# Patient Record
Sex: Female | Born: 1970
Health system: Southern US, Community
[De-identification: ages and names within clinical notes are randomized; demographics above are authoritative.]

## PROBLEM LIST (undated history)

## (undated) DIAGNOSIS — J45909 Unspecified asthma, uncomplicated: Secondary | ICD-10-CM

## (undated) HISTORY — PX: TUBAL LIGATION: SHX77

---

## 2002-02-04 ENCOUNTER — Emergency Department (HOSPITAL_COMMUNITY): Admission: EM | Admit: 2002-02-04 | Discharge: 2002-02-04 | Payer: Self-pay | Admitting: Emergency Medicine

## 2003-06-24 ENCOUNTER — Emergency Department (HOSPITAL_COMMUNITY): Admission: EM | Admit: 2003-06-24 | Discharge: 2003-06-24 | Payer: Self-pay | Admitting: *Deleted

## 2003-06-24 ENCOUNTER — Encounter: Payer: Self-pay | Admitting: Emergency Medicine

## 2015-07-25 ENCOUNTER — Ambulatory Visit
Admission: EM | Admit: 2015-07-25 | Discharge: 2015-07-25 | Discharge status: Absent without leave | Payer: BLUE CROSS/BLUE SHIELD

## 2015-07-25 ENCOUNTER — Encounter: Payer: Self-pay | Admitting: *Deleted

## 2015-07-25 ENCOUNTER — Ambulatory Visit: Payer: BLUE CROSS/BLUE SHIELD

## 2015-07-25 ENCOUNTER — Ambulatory Visit
Admission: EM | Admit: 2015-07-25 | Discharge: 2015-07-25 | Disposition: A | Discharge status: Home / Self Care | Payer: BLUE CROSS/BLUE SHIELD | Attending: Family Medicine | Admitting: Family Medicine

## 2015-07-25 DIAGNOSIS — S83001A Unspecified subluxation of right patella, initial encounter: Secondary | ICD-10-CM

## 2015-07-25 HISTORY — DX: Unspecified asthma, uncomplicated: J45.909

## 2015-07-25 MED ORDER — KETOROLAC TROMETHAMINE 60 MG/2ML IM SOLN
60.0000 mg | Freq: Once | INTRAMUSCULAR | Status: AC
  Administered 2015-07-25: 60 mg via INTRAMUSCULAR

## 2015-07-25 MED ORDER — TETANUS-DIPHTH-ACELL PERTUSSIS 5-2.5-18.5 LF-MCG/0.5 IM SUSP
0.5000 mL | Freq: Once | INTRAMUSCULAR | Status: AC
  Administered 2015-07-25: 0.5 mL via INTRAMUSCULAR

## 2015-07-25 NOTE — Discharge Instructions (Signed)
Please report directly to [facility] for evaluation and additional care- Do not initiate these exercises without their recommendation  Patellar Dislocation and Subluxation with Phase I Rehab Injuries to the knee often include kneecap (patellar) dislocation or subluxation. The patella is a V-shaped bone that sits in a groove in the thigh bone (trochlea). A patellar dislocation occurs when the kneecap is displaced from the trochlea, and the joint surfaces are no longer touching. A subluxation is a similar injury, where the kneecap becomes displaced, but the joint surfaces are still touching. Patellar dislocations and subluxations are most common in adolescents and younger adults. SYMPTOMS   Severe pain in the front of the knee when attempting to move the knee.  A feeling of the knee giving way.  Tenderness, swelling, and bruising (contusion) of the knee.  Numbness or paralysis below the dislocation, from pinching, cutting, or pressure on the blood vessels or nerves (uncommon).  Visible deformity, especially if the dislocation of the kneecap occurs towards the outside of the knee.  Lump on the inner knee, which is the end of the inner part of the thigh bone (femur). CAUSES  Patellar dislocations are caused by a force placed on the kneecap, that is strong enough to displace the bone from its proper alignment. Common causes of injury include:  Direct hit (trauma) to the knee.  Twisting or pivoting injury to the lower limb, when the foot is planted on the ground.  Powerful muscle contraction.  Birth defect (congenital abnormality), such as shallow or malformed joint surfaces. RISK INCREASES WITH:  Contact sports (football, rugby, soccer), sports that require jumping and landing (basketball, volleyball), or sports in which cleats are worn on shoes.  People with wide pelvis, knocked knees, shallow or malformed joint surfaces.  Previous knee injury.  Poor strength and  flexibility. PREVENTION  Warm up and stretch properly before activity.  Maintain physical fitness:  Strength, flexibility, and endurance.  Cardiovascular fitness.  For jumping or contact sports, protect the kneecap with supportive devices (elastic bandages, tape, braces, knee sleeves with a hole for the patella and a built-up outer side, or straps to pull the patella inward, or knee pads).  Use cleats of proper length. PROGNOSIS  If treated properly, patellar dislocations and subluxations usually require at least 6 weeks to heal.  RELATED COMPLICATIONS   Associated fracture or joint cartilage injury.  Damage to nearby nerves or major blood vessels (rare).  Longer healing time or recurring dislocation, if activity is resumed too soon.  Excessive bleeding within the knee, due to dislocation.  Kneecap pain and giving way, often due to inadequate or incomplete rehabilitation.  Unstable or arthritic joint, following repeated injury or delayed treatment. TREATMENT Patellar dislocations and subluxations require immediate realigning of the bones (reduction). Realigning is often completed by hand. However, surgery is sometimes needed. After realignment, treatment first involves the use of ice and medicine, to reduce pain and inflammation. Elevating the injured knee above the level of the heart will also help reduce inflammation. Restraining the knee is often needed, to allow for healing, for up to 6 weeks. After restraint, it is important to perform strengthening and stretching exercises to help regain strength and a full range of motion. These exercises may be completed at home or with a therapist. MEDICATION   If pain medicine is needed, nonsteroidal anti-inflammatory medicines (aspirin and ibuprofen), or other minor pain relievers (acetaminophen), are often advised.  Do not take pain medicine for 7 days before surgery.  Prescription pain  relievers may be given, if your caregiver thinks  they are needed. Use only as directed and only as much as you need. HEAT AND COLD  Cold treatment (icing) should be applied for 10 to 15 minutes every 2 to 3 hours for inflammation and pain, and immediately after activity that aggravates your symptoms. Use ice packs or an ice massage.  Heat treatment may be used before performing stretching and strengthening activities prescribed by your caregiver, physical therapist, or athletic trainer. Use a heat pack or a warm water soak. SEEK MEDICAL CARE IF:  Pain, tenderness, or swelling gets worse, despite treatment.  You experience pain, numbness, or coldness in the foot.  Blue, gray, or dark color appears in the toenails.  Any of the following signs of infection occur after surgery: fever, increased pain, swelling, redness, drainage of fluids, or bleeding in the affected area.  New, unexplained symptoms develop. (Drugs used in treatment may produce side effects.) EXERCISES PHASE I EXERCISES RANGE OF MOTION (ROM) AND STRETCHING EXERCISES - Patellar Dislocation and Subluxation Phase I  FIRST TIME DISLOCATIONS OR SUBLUXATIONS: If you have dislocated or subluxed your kneecap for the first time, your caregiver may ask you to wear a knee brace for up to 6 weeks after your injury. This brace will keep your knee completely straight so that the healing tissues are not disrupted by your knee movement. Your caregiver may allow some motion at your knee by using a hinged brace or by allowing you to take your brace off for a limited amount of time to gently bend and straighten your knee. If this is the case, closely follow your caregiver's directions, in order to allow the best recovery.   CHRONIC OR REPEATED DISLOCATIONS OR SUBLUXATIONS: If you have chronic or repeated dislocations or subluxations, your caregiver will likely not ask you to use a brace. He or she will likely have you begin gentle range of motion activities, within the range that is comfortable  for you.  Once you are allowed to start moving your knee, these are some of the initial exercises that may be included in your rehabilitation program. Continue to perform them until you see your caregiver again or until your symptoms go away. While completing these exercises, remember:   Restoring tissue flexibility helps normal motion to return to the joints. This allows healthier, less painful movement and activity.  An effective stretch should be held for at least 30 seconds.  A stretch should never be painful. You should only feel a gentle lengthening or release in the stretched tissue. RANGE OF MOTION - Knee Flexion, Active  Lie on your back with both knees straight. (If this causes back discomfort, bend your opposite knee, placing your foot flat on the floor.)  Slowly slide your heel back toward your buttocks until you feel a gentle stretch in the front of your knee or thigh.  Hold for __________ seconds. Slowly slide your heel back to the starting position. Repeat __________ times. Complete this exercise __________ times per day.  RANGE OF MOTION - Knee Flexion and Extension, Active-Assisted  Sit on the edge of a table or chair with your thighs firmly supported. It may be helpful to place a folded towel under the end of your right / left thigh.  Flexion (bending): Place the ankle of your healthy leg on top of the other ankle. Use your healthy leg to gently bend your right / left knee until you feel a mild tension across the top of your  knee.  Hold for __________ seconds.  Extension (straightening): Switch your ankles so your right / left leg is on top. Use your healthy leg to straighten your right / left knee until you feel a mild tension on the backside of your knee.  Hold for __________ seconds. Repeat __________ times. Complete this exercise __________ times per day. STRETCH - Knee Flexion, Supine  Lie on the floor with your right / left heel and foot lightly touching the  wall. (Place both feet on the wall if you do not use a door frame.)  Without using any effort, allow gravity to slide your foot down the wall slowly until you feel a gentle stretch in the front of your right / left knee.  Hold this stretch for __________ seconds. Then return the leg to the starting position, using your healthy leg for help, if needed. Repeat __________ times. Complete this stretch __________ times per day.  STRENGTHENING EXERCISES - Patellar Dislocation and Subluxation Phase I These exercises may help you when beginning to rehabilitate your injury. They may resolve your symptoms with or without further involvement from your physician, physical therapist or athletic trainer. While completing these exercises, remember:   Muscles can gain both the endurance and the strength needed for everyday activities through controlled exercises.  Complete these exercises as instructed by your physician, physical therapist or athletic trainer. Increase the resistance and repetitions only as guided by your caregiver. STRENGTH - Quadriceps, Isometrics  Lie on your back with your right / left leg extended and your opposite knee bent.  Gradually tense the muscles in the front of your right / left thigh. You should see either your kneecap slide up toward your hip or increased dimpling just above the knee. This motion will push the back of the knee down toward the floor, mat, or bed on which you are lying.  Hold the muscle as tight as you can, without increasing your pain, for __________ seconds.  Relax the muscles slowly and completely in between each repetition. Repeat __________ times. Complete this exercise __________ times per day.  STRENGTH - Quadriceps, Straight Leg Raises Quality counts! Watch for signs that the quadriceps muscle is working, to be sure you are strengthening the correct muscles and not "cheating" by substituting with healthier muscles.  Lay on your back with your right /  left leg extended and your opposite knee bent.  Tense the muscles in the front of your right / left thigh. You should see either your kneecap slide up or increased dimpling just above the knee. Your thigh may even shake a bit.  Tighten these muscles even more and raise your leg 4 to 6 inches off the floor. Hold for __________ seconds.  Keeping these muscles tense, lower your leg.  Relax the muscles slowly and completely between each repetition. Repeat __________ times. Complete this exercise __________ times per day.  STRENGTH - Hip Abductors, Straight Leg Raises Be aware of your form throughout the entire exercise, so that you exercise the correct muscles. Poor form means that you are not strengthening the correct muscles.  Lie on your side so that your head, shoulders, knee and hip line up. You may bend your lower knee to help maintain your balance. Your right / left leg should be on top.  Roll your hips slightly forward, so that your hips are stacked directly over each other and your right / left knee is facing forward.  Lift your top leg up 4-6 inches, leading with your  heel. Be sure that your foot does not drift forward or that your knee does not roll toward the ceiling.  Hold this position for __________ seconds. You should feel the muscles in your outer hip lifting. (You may not notice this until your leg begins to tire.)  Slowly lower your leg to the starting position. Allow the muscles to fully relax before beginning the next repetition. Repeat __________ times. Complete this exercise __________ times per day.  STRENGTH - Hip Extensors, Straight Leg Raises  Lie on your stomach on a firm surface.  Tense the muscles in your buttocks to lift your right / left leg about 4 inches. If you cannot lift your leg this high without arching your back, place a pillow under your hips.  Keep your knee straight. Hold __________ seconds.  Slowly lower your leg to the starting position and  allow it to relax completely before completing the next repetition. Repeat __________ times. Complete this exercise __________ times per day.  STRENGTH - Hip Adductors, Straight Leg Raises  Lie on your side so that your head, shoulders, knee and hip line up. You may place your upper foot in front, to help maintain your balance. Your right / left leg should be on the bottom.  Roll your hips slightly forward, so that your hips are stacked directly over each other and your right / left knee is facing forward.  Tense the muscles in your inner thigh and lift your bottom leg 4-6 inches. Hold this position for __________ seconds.  Slowly lower your leg to the starting position. Allow the muscles to fully relax before beginning the next repetition. Repeat __________ times. Complete this exercise __________ times per day.  Document Released: 10/26/2005 Document Revised: 01/18/2012 Document Reviewed: 02/07/2009 [facility] Patient Information 2015 [place], [place]. This information is not intended to replace advice given to you by your health care provider. Make sure you discuss any questions you have with your health care provider.

## 2015-07-25 NOTE — ED Notes (Signed)
Patient fell on her right knee in a parking lot today at 11:00 hrs. Bleeding through clothing is present at right knee.

## 2015-07-26 ENCOUNTER — Encounter: Payer: Self-pay | Admitting: Physician Assistant

## 2015-07-26 NOTE — ED Provider Notes (Signed)
CSN: [id number]     Arrival date & time 07/25/15  1337 History   First MD Initiated Contact with Patient 07/25/15 1507     Chief Complaint  Patient presents with  . Knee Injury    right side   (Consider location/radiation/quality/duration/timing/severity/associated sxs/prior Treatment) HPI 44 yo F reports earlier this afternoon she stepped onto curb with left foot, and remembers falling and striking her right knee on the cement.  Unsure whether her right  foot caught on the curb and dropped her down, or possibly after trying to weight it, had it collapse to strike the pavement.  She entertained both scenarios, unsure. Presents with moderately severe right knee pain. Inability to bear weight and a small abrasion of right knee   Past Medical History  Diagnosis Date  . Asthma    Past Surgical History  Procedure Laterality Date  . Tubal ligation     History reviewed. No pertinent family history. Social History  Substance Use Topics  . Smoking status: Never Smoker   . Smokeless tobacco: Never Used  . Alcohol Use: No   OB History    No data available     Review of Systems Review of 10 systems negative for acute change except as referenced in HPI Allergies  Peanuts and Percocet  Home Medications   Prior to Admission medications   Medication Sig Start Date End Date Taking? Authorizing Provider  ferrous fumarate (HEMOCYTE - 106 MG FE) 325 (106 FE) MG TABS tablet Take 1 tablet by mouth daily.   Yes Historical Provider, MD   Meds Ordered and Administered this Visit   Medications  ketorolac (TORADOL) injection 60 mg (60 mg Intramuscular Given 07/25/15 1523)  Tdap (BOOSTRIX) injection 0.5 mL (0.5 mLs Intramuscular Given 07/25/15 1521)  both well tolerated  BP 140/70 mmHg  Pulse 80  Temp(Src) 97.3 F (36.3 C) (Tympanic)  Resp 16  Ht 5\' 3"  (1.6 m)  Wt 205 lb (92.987 kg)  BMI 36.32 kg/m2  SpO2 97% No data found.   Physical Exam Constitutional: Alert and oriented, well  appearing, VS are noted,  5'3"  205 General : no acute distress; HEENT:  Head:normocephalic, atraumatic,                Eyes: conjugate gaze,negative conjunctiva     Ears:hearing  WNL     Nose:normal,     Mouth/throat :Mucous membranes moist Neck :  supple Lung:   effort and breath sounds normal ,  Heart:   normal rate,regular rhythm,  Back:    No CVAT, no spinal tenderness noted MSK:   Right knee is tender and swollen, small central abrasion. Patellar dimples distorted -area around patella extremely tender, cannot flex without significant  pain. Popliteal space is maintained, There is no trauma noted to upper or lower leg- pain focused on knee. Good distal pulses- DTRs not attempted.  Left leg is  nontender, normal ROM ;uninjured Neuro:,grossly intact; , normal speech and language Skin:  Warm,dry,intact- see HPI abrasion Psych: mood and affect WNL  ED Course  Procedures (including critical care time)  Labs Review Labs Reviewed - No data to display  Imaging Review Dg Knee Complete 4 Views Right  07/25/2015   CLINICAL DATA:  Pain following fall onto cement surface  EXAM: RIGHT KNEE - COMPLETE 4+ VIEW  COMPARISON:  None.  FINDINGS: Frontal, lateral, and bilateral oblique views were obtained. There is no demonstrable fracture or dislocation. There is questionable mild lateral patellar subluxation. No joint effusion.  Joint spaces appear intact. No erosive change.  IMPRESSION: Suspect a mild degree of lateral patellar subluxation. No fracture or frank dislocation. No joint effusion. No appreciable joint space narrowing.   Electronically Signed   By: Bretta Bang III M.D.   On: 07/25/2015 14:26    Medications  ketorolac (TORADOL) injection 60 mg (60 mg Intramuscular Given 07/25/15 1523)  Tdap (BOOSTRIX) injection 0.5 mL (0.5 mLs Intramuscular Given 07/25/15 1521)  Very relieved by Toradol Rx and the support of knee immobilizer- pain significant Last tetanus unknown- updated  now.    MDM   1. Patellar subluxation, right, initial encounter    Plan: 1. Test/x-ray results and diagnosis reviewed with patient- mild  subluxation on films 2. She is from [facility] and familiar with [facility]- will transfer to them at this time for recommendation Office hours are currently over but they have on site [facility] 3. Recommend supportive treatment with immobilizer and ice packs 4. Seek additional medical care if symptoms worsen or don't improve- prefer direct consult at this time Diagnosis and treatment discussed. Questions fielded, expectations and recommendations reviewed.  Patient expresses understanding. Will return to [facility] with questions, concern or exacerbation.    Rae Halsted, PA-C 07/26/15 2250

## 2016-08-26 IMAGING — CR DG KNEE COMPLETE 4+V*R*
4 series · 4 of 4 positions shown · non-contrast
Comparison: None.

CLINICAL DATA: Pain following fall onto cement surface

EXAM:
RIGHT KNEE - COMPLETE 4+ VIEW

[knee ap]
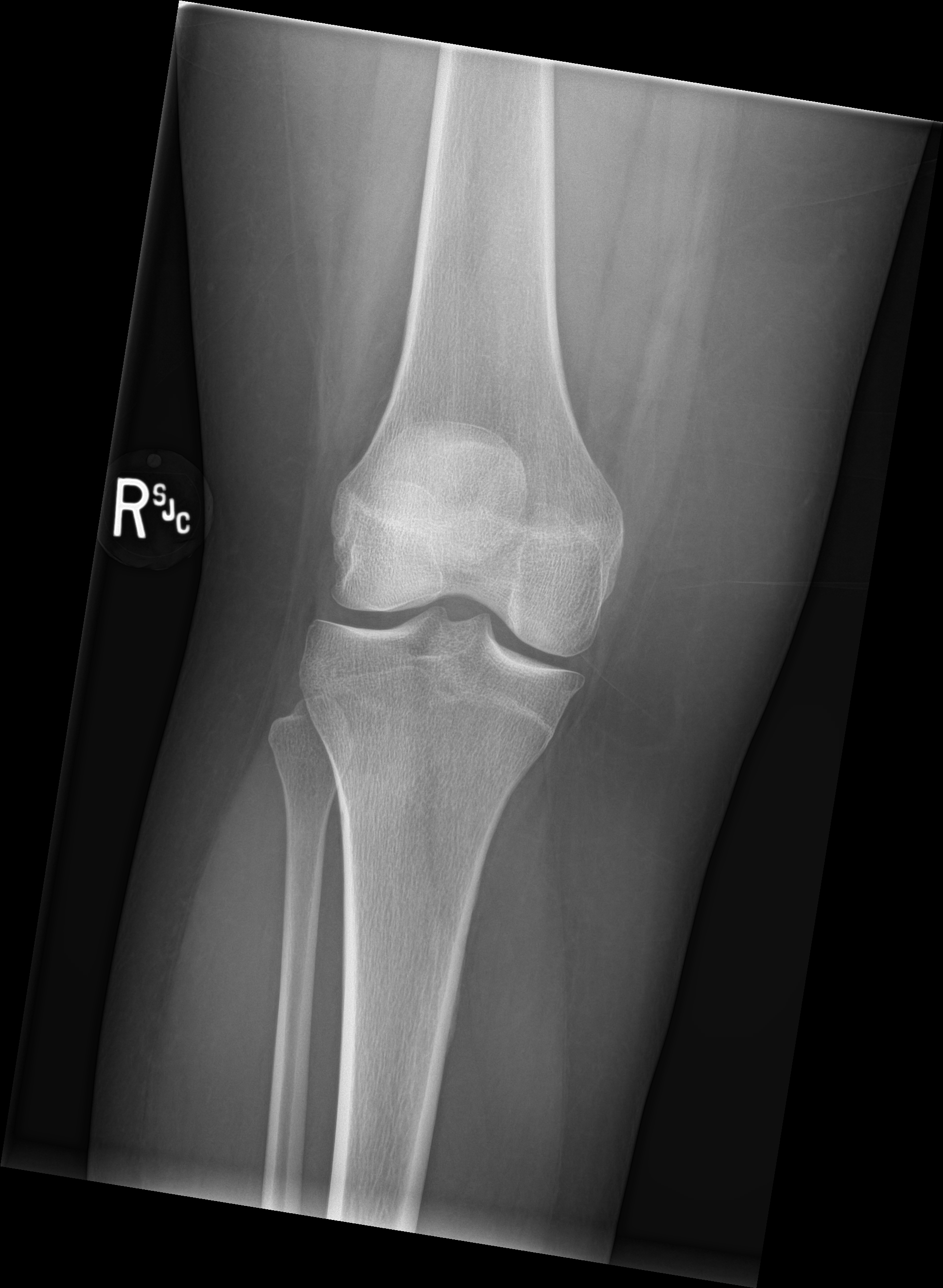

[tunnel]
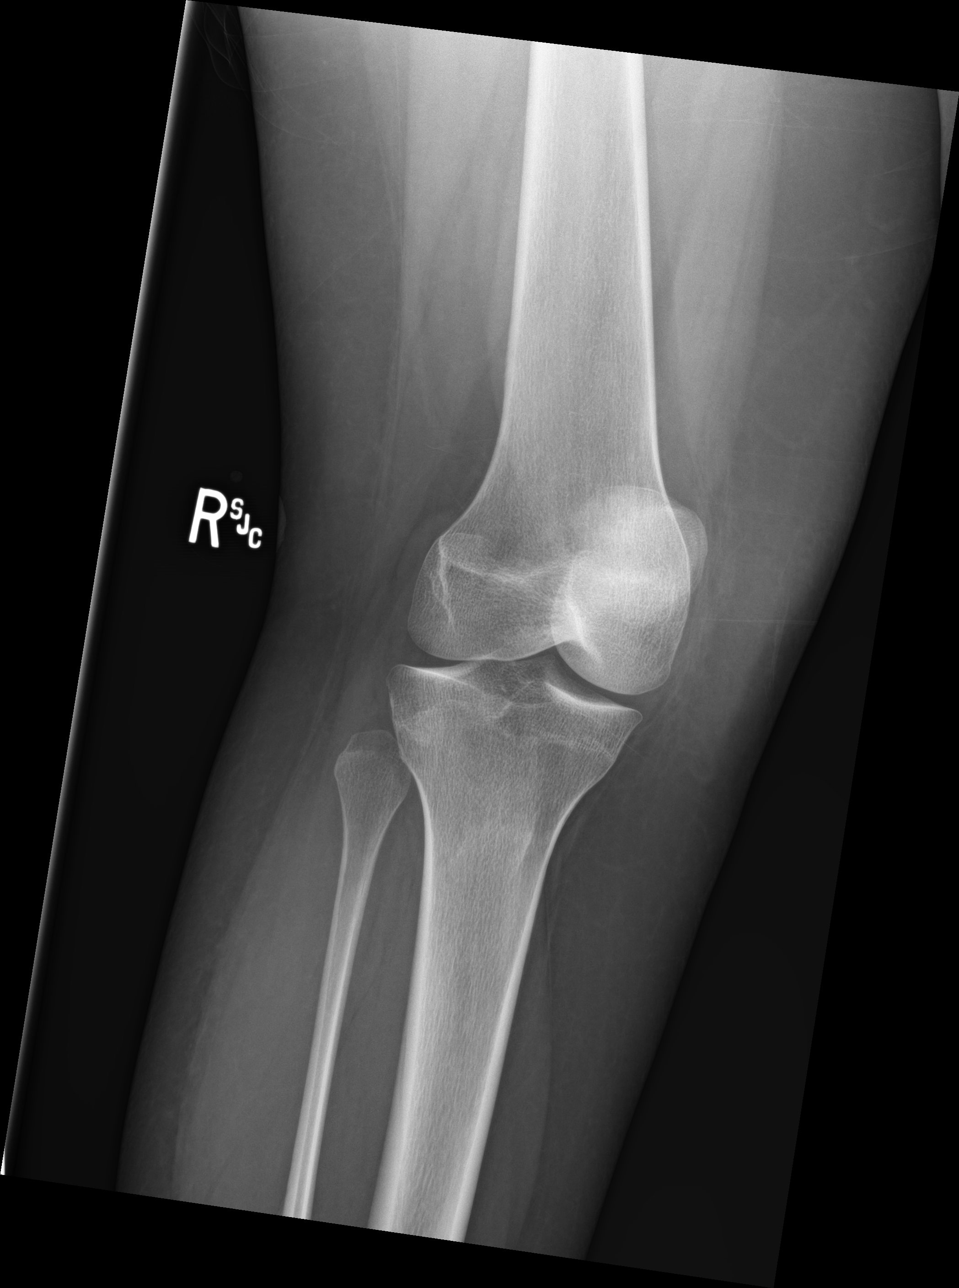

[knee lat]
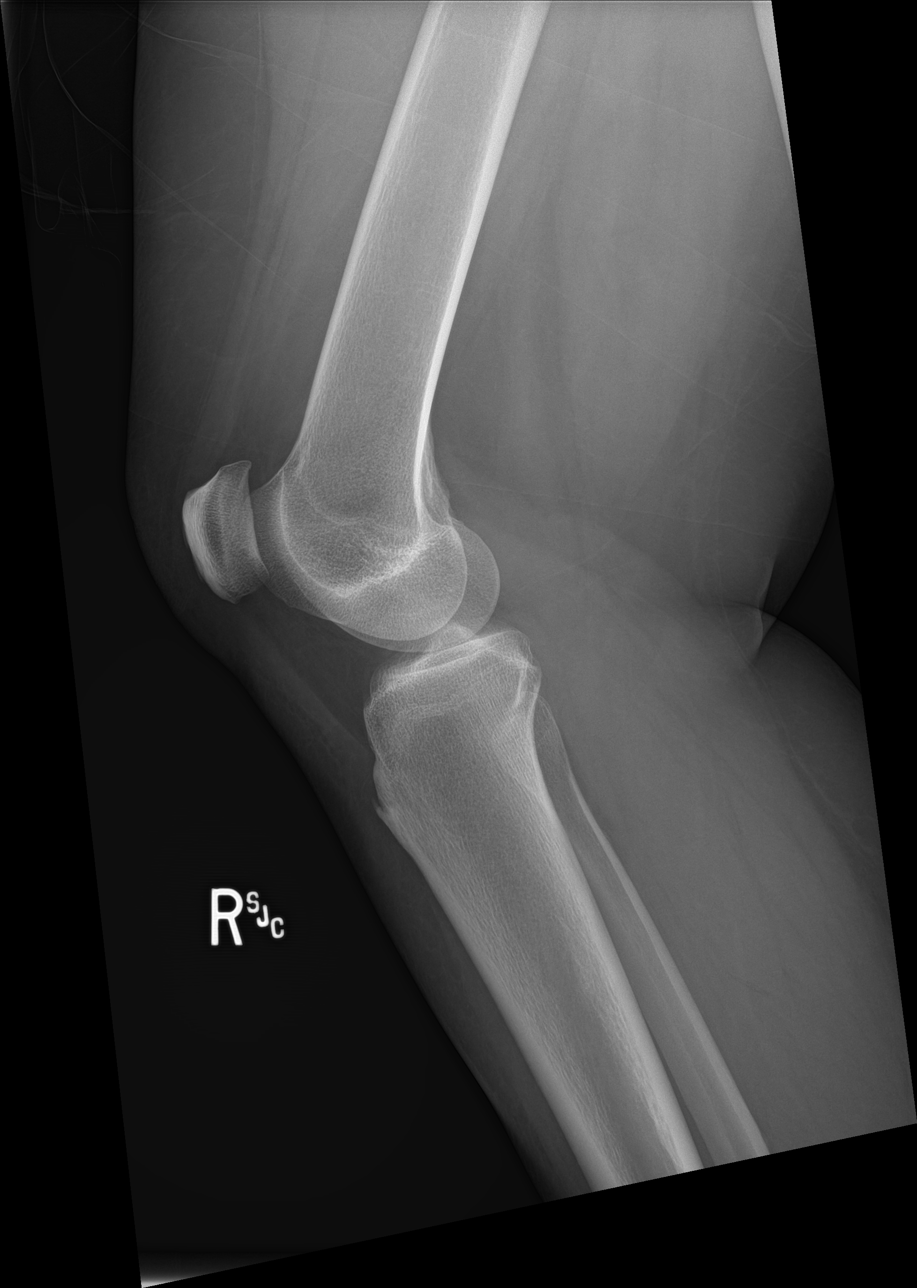

[patella skyline]
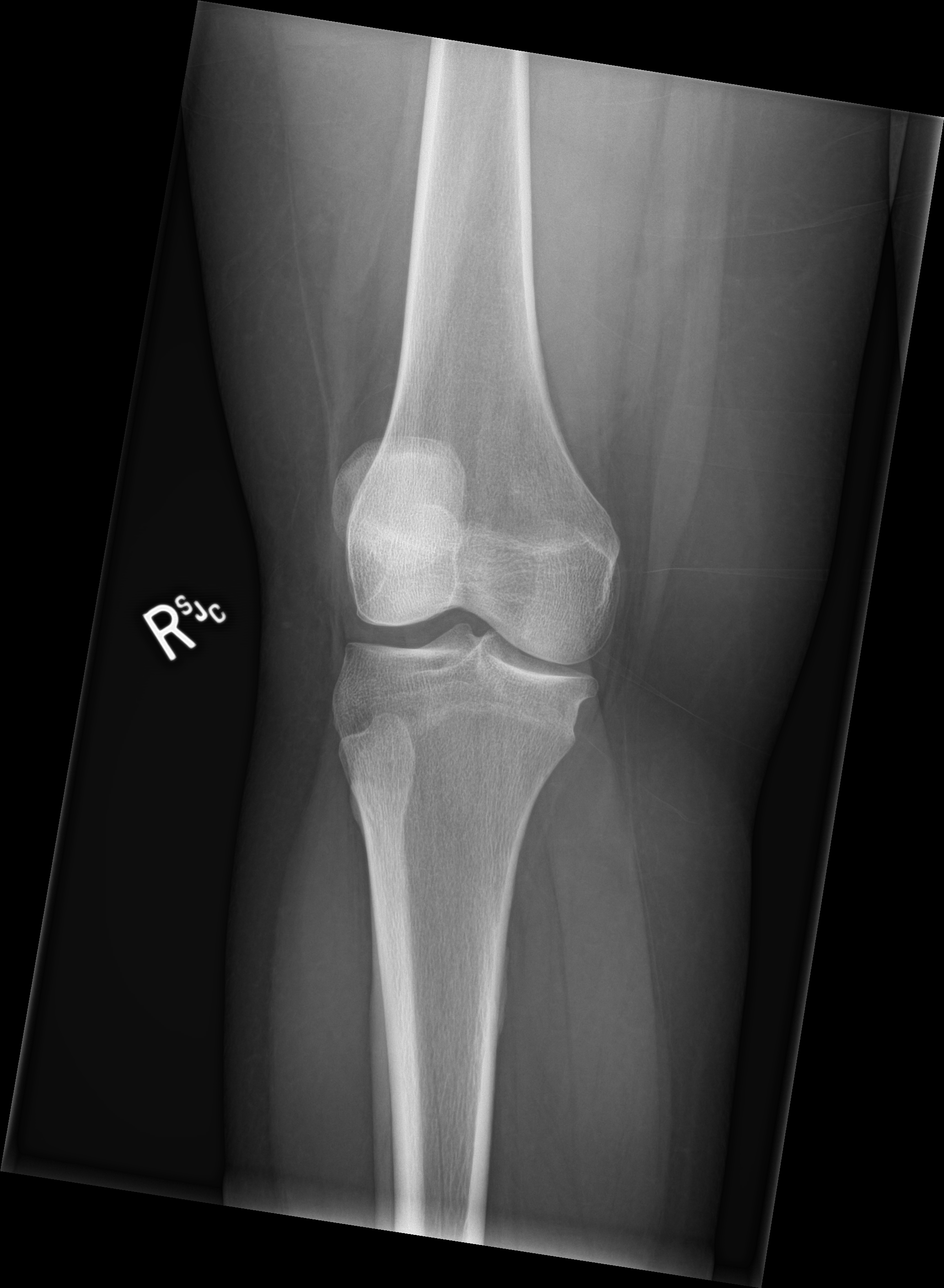

[4 of 4 positions shown; findings below may reference images not displayed]

FINDINGS: Frontal, lateral, and bilateral oblique views were obtained. There
is no demonstrable fracture or dislocation. There is questionable
mild lateral patellar subluxation. No joint effusion. Joint spaces
appear intact. No erosive change.
IMPRESSION: Suspect a mild degree of lateral patellar subluxation. No fracture
or frank dislocation. No joint effusion. No appreciable joint space
narrowing.
